# Patient Record
Sex: Male | Born: 1955
Health system: Southern US, Community
[De-identification: ages and names within clinical notes are randomized; demographics above are authoritative.]

## PROBLEM LIST (undated history)

## (undated) DIAGNOSIS — N529 Male erectile dysfunction, unspecified: Secondary | ICD-10-CM

## (undated) HISTORY — PX: TONSILLECTOMY: SUR1361

---

## 2009-06-02 ENCOUNTER — Encounter: Admission: RE | Admit: 2009-06-02 | Discharge: 2009-06-02 | Payer: Self-pay | Admitting: Family Medicine

## 2011-01-17 IMAGING — CT CT ABDOMEN WO/W CM
2 of 8 series · 14 of 46 positions shown, 19 images · IV contrast (READICAT/WATER & [ID] OMNI 300)
Comparison: None

CT ABDOMEN

CLINICAL DATA: Right lower quadrant pain.  Concern for
appendicitis colitis or kidney stones.

CT ABDOMEN WITHOUT AND WITH CONTRAST
CT PELVIS WITH CONTRAST
TECHNIQUE: Multidetector CT imaging of the abdomen was performed
initially following the standard protocol before administration of
intravenous contrast.  Multidetector CT imaging of the abdomen and
pelvis was then performed following the standard protocol during
the bolus injection of intravenous contrast.
Contrast: 125 ml Omnipaque 300

[Series 4: a&p w/ · axial · 0.74mm/px · z∈[-376,-1]mm · 11 of 89 slices shown, 16 images]
[im 7/89  soft-tissue]
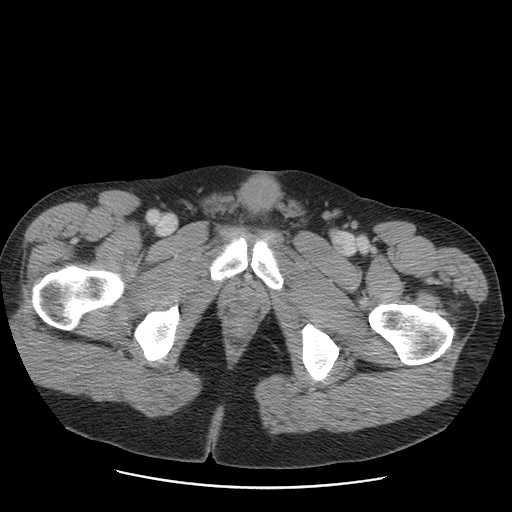
[im 7/89  bone]
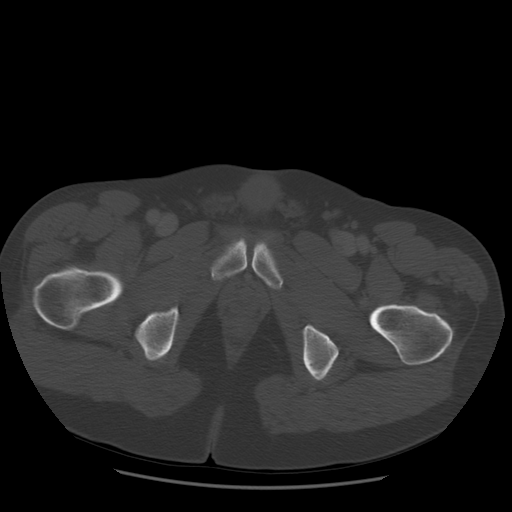
[im 14/89  soft-tissue]
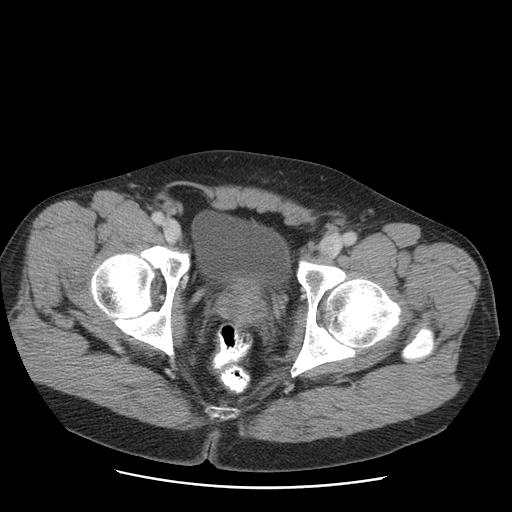
[im 28/89  soft-tissue]
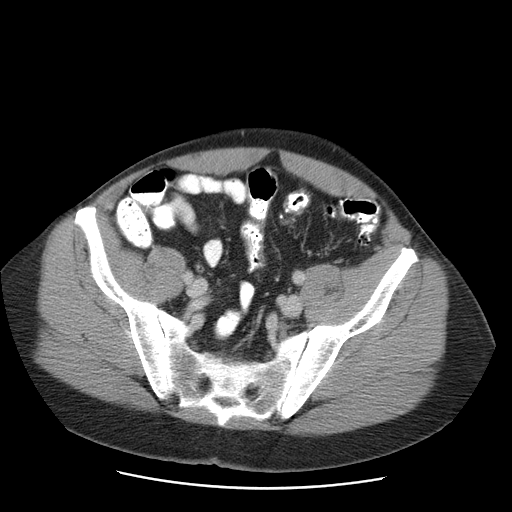
[im 34/89  soft-tissue]
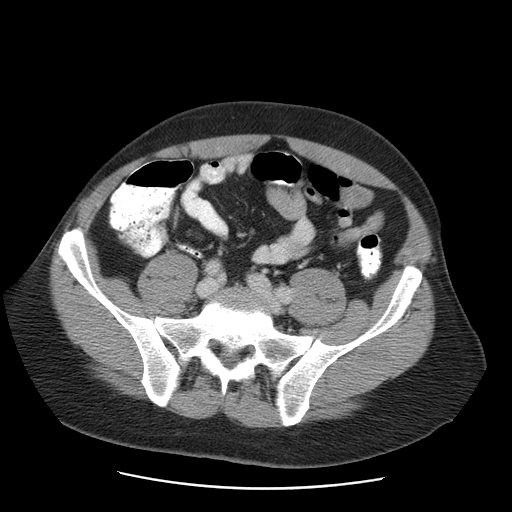
[im 41/89  soft-tissue]
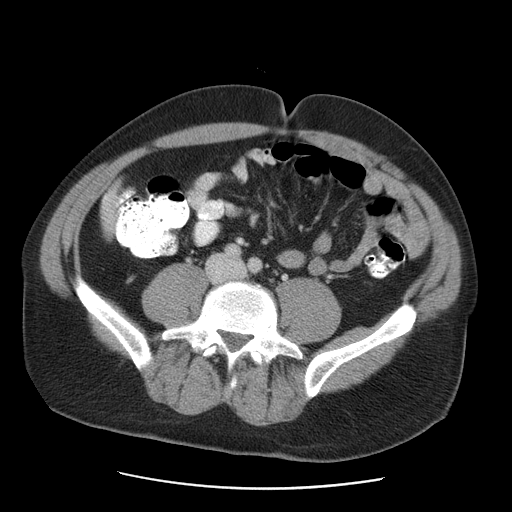
[im 48/89  soft-tissue]
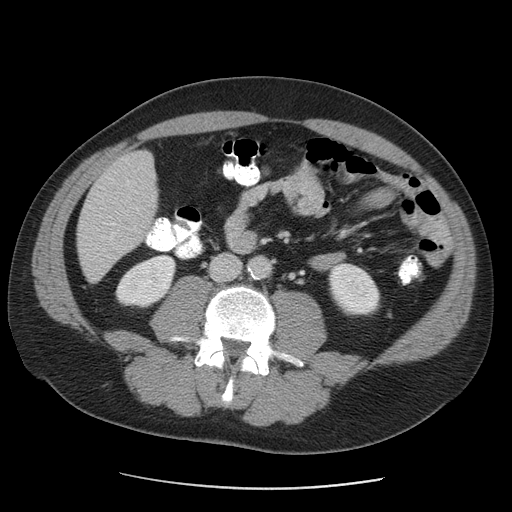
[im 55/89  soft-tissue]
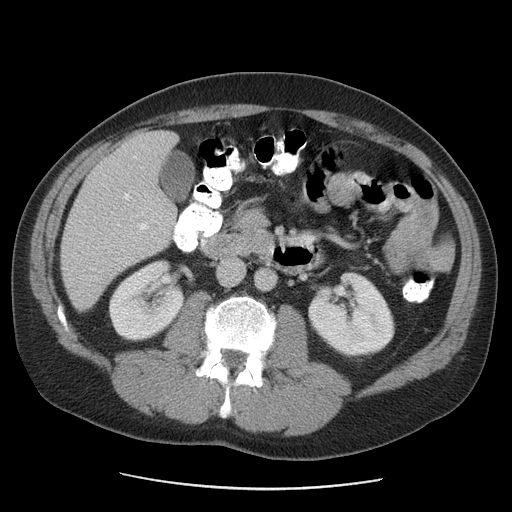
[im 61/89  lung]
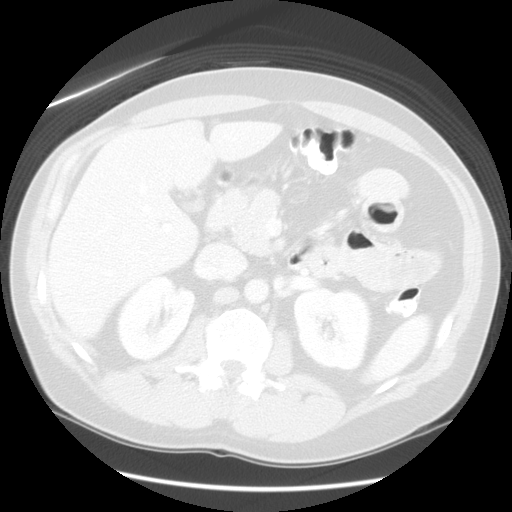
[im 68/89  soft-tissue]
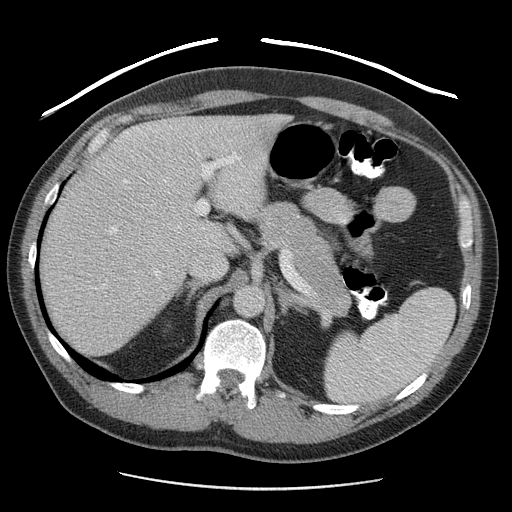
[im 68/89  lung]
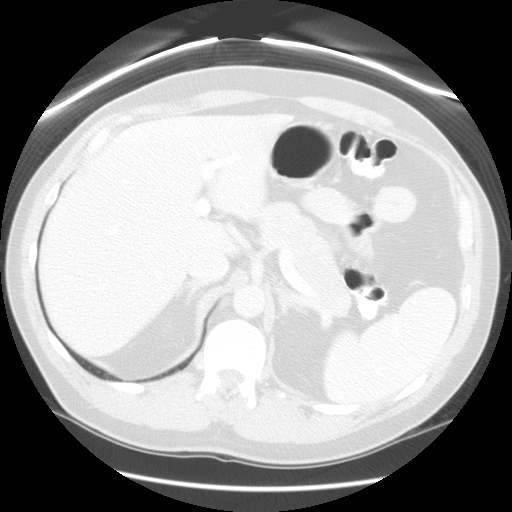
[im 75/89  soft-tissue]
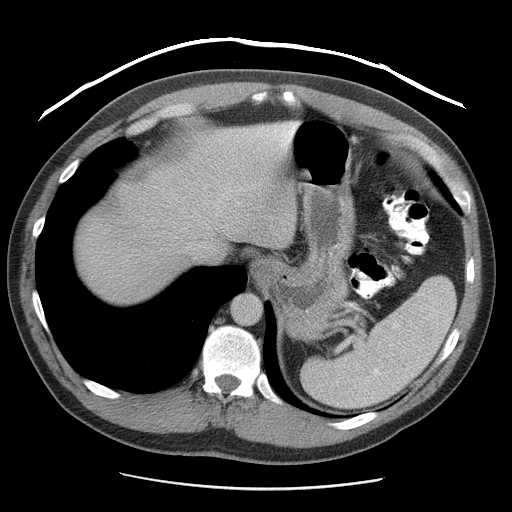
[im 75/89  lung]
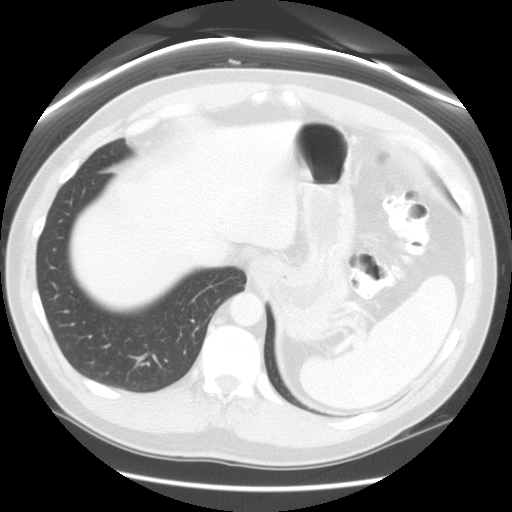
[im 75/89  bone]
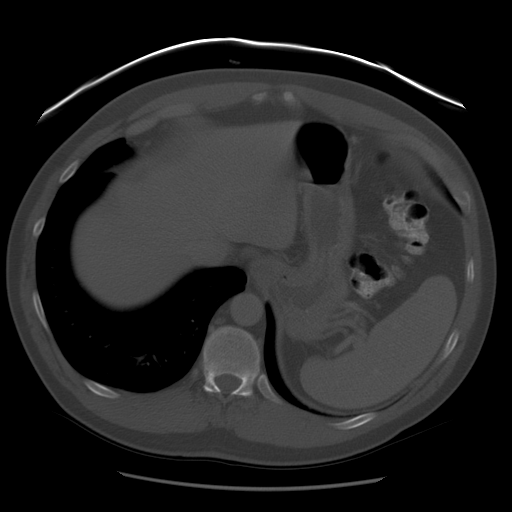
[im 82/89  soft-tissue]
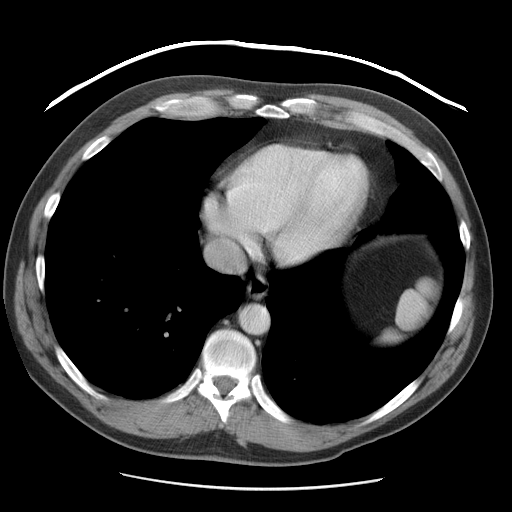
[im 82/89  lung]
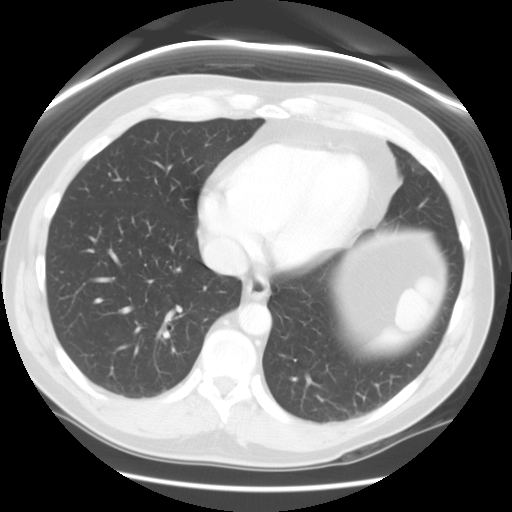

[Series 300: cor abd w/o · coronal · non-contrast · 0.74mm/px · 3 of 113 slices shown]
[im 38/113  soft-tissue]
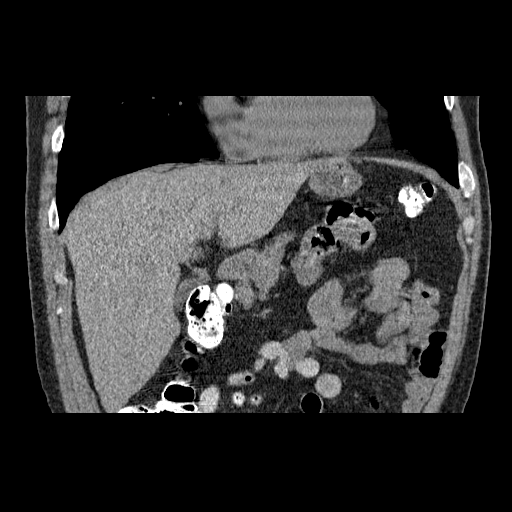
[im 50/113  soft-tissue]
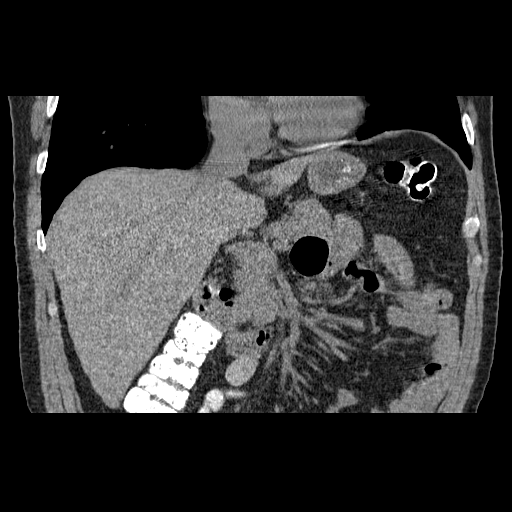
[im 63/113  soft-tissue]
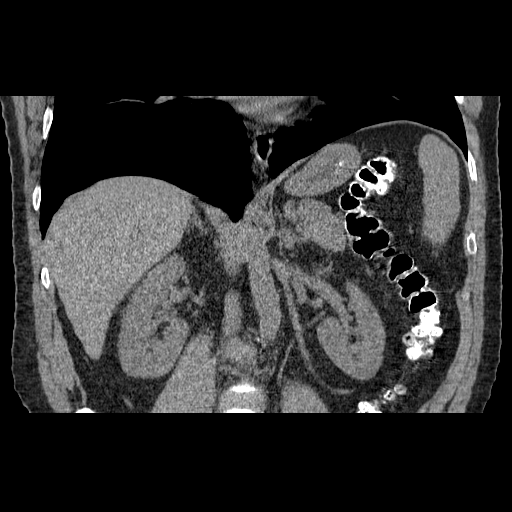

[14 of 46 positions shown; findings below may reference images not displayed]

FINDINGS: Lung bases are clear.  The base of the heart appears
normal.

No focal hepatic lesion.  The gallbladder, pancreas, spleen,
adrenal glands, and then the kidneys appear normal.  No evidence of
nephrolithiasis, ureterolithiasis or obstructive uropathy.  There
is a low density cortical lesion within the right kidney which is
too small to characterize.

The stomach, duodenum, small bowel, appendix, and cecum appear
normal.  Colon appears normal.

Abdominal aorta is normal caliber.  No retroperitoneal or
peritoneal lymphadenopathy.
IMPRESSION: 1.  No acute abdominal process.
2.  Normal appendix.
3.  No evidence of obstructive uropathy.

CT PELVIS
FINDINGS: The bladder, prostate and seminal vesicles appear
normal.  The rectum appears normal.  There is mild diverticular
disease of the proximal sigmoid colon without evidence of acute
inflammation.

No evidence of pelvic lymphadenopathy. Review of  bone windows
demonstrates no aggressive osseous lesions.
IMPRESSION: 1.  No acute pelvic process.
2.  Mild sigmoid diverticulosis without evidence of acute
diverticulitis.
3.  No explanation for right lower quadrant pain.

## 2018-02-18 DIAGNOSIS — I779 Disorder of arteries and arterioles, unspecified: Secondary | ICD-10-CM | POA: Diagnosis not present

## 2018-03-15 DIAGNOSIS — I83892 Varicose veins of left lower extremities with other complications: Secondary | ICD-10-CM | POA: Diagnosis not present

## 2018-03-15 DIAGNOSIS — L97301 Non-pressure chronic ulcer of unspecified ankle limited to breakdown of skin: Secondary | ICD-10-CM | POA: Diagnosis not present

## 2018-03-15 DIAGNOSIS — I83003 Varicose veins of unspecified lower extremity with ulcer of ankle: Secondary | ICD-10-CM | POA: Diagnosis not present

## 2018-03-18 ENCOUNTER — Other Ambulatory Visit (HOSPITAL_COMMUNITY): Payer: Self-pay | Admitting: Family Medicine

## 2018-03-18 DIAGNOSIS — L97321 Non-pressure chronic ulcer of left ankle limited to breakdown of skin: Secondary | ICD-10-CM

## 2018-03-18 DIAGNOSIS — I83892 Varicose veins of left lower extremities with other complications: Secondary | ICD-10-CM

## 2018-03-19 ENCOUNTER — Ambulatory Visit (HOSPITAL_COMMUNITY)
Admission: RE | Admit: 2018-03-19 | Discharge: 2018-03-19 | Disposition: A | Discharge status: Home / Self Care | Payer: 59 | Source: Ambulatory Visit | Attending: Vascular Surgery | Admitting: Vascular Surgery

## 2018-03-19 DIAGNOSIS — I83892 Varicose veins of left lower extremities with other complications: Secondary | ICD-10-CM | POA: Diagnosis not present

## 2018-03-19 DIAGNOSIS — L97321 Non-pressure chronic ulcer of left ankle limited to breakdown of skin: Secondary | ICD-10-CM | POA: Diagnosis not present

## 2018-03-20 ENCOUNTER — Encounter: Payer: Self-pay | Admitting: Vascular Surgery

## 2018-03-20 ENCOUNTER — Ambulatory Visit (INDEPENDENT_AMBULATORY_CARE_PROVIDER_SITE_OTHER): Payer: 59 | Admitting: Vascular Surgery

## 2018-03-20 ENCOUNTER — Other Ambulatory Visit: Payer: Self-pay

## 2018-03-20 VITALS — BP 130/75 | HR 104 | Resp 20 | Ht 71.0 in | Wt 210.6 lb

## 2018-03-20 DIAGNOSIS — I83892 Varicose veins of left lower extremities with other complications: Secondary | ICD-10-CM | POA: Diagnosis not present

## 2018-03-20 DIAGNOSIS — I868 Varicose veins of other specified sites: Secondary | ICD-10-CM

## 2018-03-20 NOTE — Progress Notes (Signed)
Referring Physician: Dr Docia Chuck  Patient name: Nicholas Silva MRN: [id number] DOB: June 27, 1955 Sex: male  REASON FOR CONSULT: Symptomatic varicose veins with bleeding leg  HPI: Nicholas Silva is a 63 y.o. male with a history of bleeding from the left ankle area.  He had a bleeding episode about 2 years ago.  He had been doing well until about 4 to 6 weeks ago when he had additional bleeding episode.  Both times it was from the left ankle.  He denies prior history of DVT.  He has no family history of varicose veins.  He has had skin discoloration of his left leg for several years.  He denies significant trauma to the left leg but thinks he may have had some minor injuries over the years.  He has not worn compression stockings in the past but yesterday got some 15 to 20 mm knee-high stockings.  He does have some intermittent swelling in the left leg and this improves with elevation.    History reviewed. No pertinent past medical history. History reviewed. No pertinent surgical history.  History reviewed. No pertinent family history.  SOCIAL HISTORY: Social History   Socioeconomic History  . Marital status: Unknown    Spouse name: Not on file  . Number of children: Not on file  . Years of education: Not on file  . Highest education level: Not on file  Occupational History  . Not on file  Social Needs  . Financial resource strain: Not on file  . Food insecurity:    Worry: Not on file    Inability: Not on file  . Transportation needs:    Medical: Not on file    Non-medical: Not on file  Tobacco Use  . Smoking status: Former Smoker    Types: Cigarettes    Last attempt to quit: 03/20/1938    Years since quitting: 80.0  . Smokeless tobacco: Never Used  Substance and Sexual Activity  . Alcohol use: Yes  . Drug use: Never  . Sexual activity: Not on file  Lifestyle  . Physical activity:    Days per week: Not on file    Minutes per session: Not on file  . Stress: Not on file    Relationships  . Social connections:    Talks on phone: Not on file    Gets together: Not on file    Attends religious service: Not on file    Active member of club or organization: Not on file    Attends meetings of clubs or organizations: Not on file    Relationship status: Not on file  . Intimate partner violence:    Fear of current or ex partner: Not on file    Emotionally abused: Not on file    Physically abused: Not on file    Forced sexual activity: Not on file  Other Topics Concern  . Not on file  Social History Narrative  . Not on file    No Known Allergies  Current Outpatient Medications  Medication Sig Dispense Refill  . methylphenidate (CONCERTA) 36 MG PO CR tablet Take by mouth.     No current facility-administered medications for this visit.     ROS:   General:  No weight loss, Fever, chills  HEENT: No recent headaches, no nasal bleeding, no visual changes, no sore throat  Neurologic: No dizziness, blackouts, seizures. No recent symptoms of stroke or mini- stroke. No recent episodes of slurred speech, or temporary blindness.  Cardiac:  No recent episodes of chest pain/pressure, no shortness of breath at rest.  No shortness of breath with exertion.  Denies history of atrial fibrillation or irregular heartbeat  Vascular: No history of rest pain in feet.  No history of claudication.  No history of non-healing ulcer, No history of DVT   Pulmonary: No home oxygen, no productive cough, no hemoptysis,  No asthma or wheezing  Musculoskeletal:  [ ]  Arthritis, [ ]  Low back pain,  [ ]  Joint pain  Hematologic:No history of hypercoagulable state.  No history of easy bleeding.  No history of anemia  Gastrointestinal: No hematochezia or melena,  No gastroesophageal reflux, no trouble swallowing  Urinary: [ ]  chronic Kidney disease, [ ]  on HD - [ ]  MWF or [ ]  TTHS, [ ]  Burning with urination, [ ]  Frequent urination, [ ]  Difficulty urinating;   Skin: No  rashes  Psychological: No history of anxiety,  No history of depression   Physical Examination  Vitals:   03/20/18 1346  BP: 130/75  Pulse: (!) 104  Resp: 20  SpO2: 95%  Weight: 210 lb 9.6 oz (95.5 kg)  Height: 5\' 11"  (1.803 m)    Body mass index is 29.37 kg/m.  General:  Alert and oriented, no acute distress HEENT: Normal Neck: No bruit or JVD Pulmonary: Clear to auscultation bilaterally Cardiac: Regular Rate and Rhythm without murmur Abdomen: Soft, non-tender, non-distended, no mass Skin: No rash, circumferential hemosiderin staining left leg gaiter area, 1 mm opening in the skin adjacent to the left medial malleolus from recent bleed, scattered large varicosities diffusely on the medial aspect of the left calf see images below       Extremity Pulses:  2+ radial, brachial, femoral, dorsalis pedis, posterior tibial pulses right side, absent posterior tibial dorsalis pedis pulse left foot slightly widened popliteal pulses bilaterally  musculoskeletal: No deformity race pretibial and pedal edema left foot and ankle area  Neurologic: Upper and lower extremity motor 5/5 and symmetric  DATA:  Patient had a venous reflux exam yesterday which showed diffuse reflux throughout the left greater saphenous vein with a vein diameter of 8 to 10 mm.  There was also diffuse reflux in the lesser saphenous vein on the left side with a vein diameter of 5 mm  ASSESSMENT: Symptomatic varicose veins with bleeding episodes now on 2 occasions.  Diffuse reflux left greater and lesser saphenous vein.  Absent pedal pulses left leg with slightly widened popliteal pulse   PLAN: Patient was given a prescription today for long leg compression stockings.  Return in 3 months time for consideration of laser ablation of the left greater and possibly the left lesser saphenous vein staged laser ablations.  When he returns we will also do an ultrasound of his popliteal artery and bilateral ABIs to make sure he  does not have an element of arterial occlusive disease or aneurysm.  Etiology of venous disease is today as well as risk benefits possible complications.   Fabienne Bruns, MD [facility] Office: [phone] Pager: [phone]

## 2018-03-21 ENCOUNTER — Other Ambulatory Visit: Payer: Self-pay

## 2018-03-21 DIAGNOSIS — I739 Peripheral vascular disease, unspecified: Secondary | ICD-10-CM

## 2018-03-30 DIAGNOSIS — Z23 Encounter for immunization: Secondary | ICD-10-CM | POA: Diagnosis not present

## 2018-06-21 DIAGNOSIS — N529 Male erectile dysfunction, unspecified: Secondary | ICD-10-CM | POA: Diagnosis not present

## 2018-07-17 ENCOUNTER — Ambulatory Visit (INDEPENDENT_AMBULATORY_CARE_PROVIDER_SITE_OTHER)
Admission: RE | Admit: 2018-07-17 | Discharge: 2018-07-17 | Disposition: A | Discharge status: Home / Self Care | Payer: 59 | Source: Ambulatory Visit | Attending: Vascular Surgery | Admitting: Vascular Surgery

## 2018-07-17 ENCOUNTER — Ambulatory Visit (HOSPITAL_COMMUNITY)
Admission: RE | Admit: 2018-07-17 | Discharge: 2018-07-17 | Disposition: A | Discharge status: Home / Self Care | Payer: 59 | Source: Ambulatory Visit | Attending: Vascular Surgery | Admitting: Vascular Surgery

## 2018-07-17 ENCOUNTER — Other Ambulatory Visit: Payer: Self-pay

## 2018-07-17 ENCOUNTER — Encounter: Payer: Self-pay | Admitting: Vascular Surgery

## 2018-07-17 ENCOUNTER — Ambulatory Visit (INDEPENDENT_AMBULATORY_CARE_PROVIDER_SITE_OTHER): Payer: 59 | Admitting: Vascular Surgery

## 2018-07-17 VITALS — BP 133/83 | HR 97 | Temp 98.0°F | Resp 18 | Ht 71.0 in | Wt 215.0 lb

## 2018-07-17 DIAGNOSIS — I739 Peripheral vascular disease, unspecified: Secondary | ICD-10-CM | POA: Diagnosis present

## 2018-07-17 DIAGNOSIS — I83812 Varicose veins of left lower extremities with pain: Secondary | ICD-10-CM

## 2018-07-17 NOTE — Progress Notes (Signed)
Patient is a 63 year old male who returns for follow-up today.  He was last seen March 20, 2018.  At that time he was complaining of symptomatic varicose veins in the left leg.  He has had several bleeding episodes in the left leg.  He denies any bleeding episodes since his last office visit.  He has been compliant wearing his compression stockings.  He states this does improve the swelling somewhat but not fully.  He has also not had complete healing of his skin.  Review of systems: He denies claudication.  He denies chest pain.  He denies shortness of breath.  Current Outpatient Medications on File Prior to Visit  Medication Sig Dispense Refill  . methylphenidate (CONCERTA) 36 MG PO CR tablet Take by mouth.     No current facility-administered medications on file prior to visit.     Physical exam:  Vitals:   07/17/18 1416  BP: 133/83  Pulse: 97  Resp: 18  Temp: 98 F (36.7 C)  TempSrc: Oral  SpO2: 95%  Weight: 215 lb (97.5 kg)  Height: 5\' 11"  (1.803 m)    Extremities: Plus dorsalis pedis pulses absent no posterior tibial pulse  Kin: Hemosiderin changes and thinning of the skin left leg as depicted from office visit, bulging varicosities left medial calf 10-20 at least these are about 6 to 7 mm diameter.  I used the SonoSite to examine the patient's left greater saphenous vein which again is 8 to 10 mm all the way the saphenofemoral junction.  Data: Patient had bilateral ABIs today which were greater than 1 and normal triphasic bilaterally.  He also had a popliteal ultrasound for widened popliteal pulse which showed no evidence of popliteal aneurysm on the left side.  I reviewed his previous venous reflux exam of the left leg which showed diffuse reflux throughout the entire left greater saphenous vein with 8 to 10 mm vein diameter.  He also had lesser saphenous vein diameter of about 5 mm.  Assessment: Symptomatic varicose veins with skin thinning skin changes prior bleeding  episode diffuse left greater saphenous reflux.  Plan: I recommended the patient laser ablation stab avulsions 10-20 left leg for CEAP class V.  His symptoms are not completely approved by this we could consider whether or not laser ablation of his left lesser saphenous would be beneficial.  Fabienne Bruns, MD [facility] Office: [phone] Pager: [phone]

## 2018-08-13 ENCOUNTER — Other Ambulatory Visit: Payer: Self-pay | Admitting: *Deleted

## 2018-08-13 DIAGNOSIS — I83812 Varicose veins of left lower extremities with pain: Secondary | ICD-10-CM

## 2018-10-02 ENCOUNTER — Other Ambulatory Visit: Payer: 59 | Admitting: Vascular Surgery

## 2018-10-09 ENCOUNTER — Encounter (HOSPITAL_COMMUNITY): Payer: 59

## 2018-10-09 ENCOUNTER — Ambulatory Visit: Payer: 59 | Admitting: Vascular Surgery

## 2018-11-12 ENCOUNTER — Other Ambulatory Visit: Payer: Self-pay | Admitting: *Deleted

## 2018-11-12 DIAGNOSIS — I83812 Varicose veins of left lower extremities with pain: Secondary | ICD-10-CM

## 2019-01-15 ENCOUNTER — Ambulatory Visit (INDEPENDENT_AMBULATORY_CARE_PROVIDER_SITE_OTHER): Payer: Self-pay | Admitting: Vascular Surgery

## 2019-01-15 ENCOUNTER — Other Ambulatory Visit: Payer: Self-pay

## 2019-01-15 ENCOUNTER — Encounter: Payer: Self-pay | Admitting: Vascular Surgery

## 2019-01-15 VITALS — BP 126/85 | HR 94 | Temp 98.3°F | Resp 16 | Ht 71.0 in | Wt 215.0 lb

## 2019-01-15 DIAGNOSIS — I83812 Varicose veins of left lower extremities with pain: Secondary | ICD-10-CM | POA: Diagnosis not present

## 2019-01-15 HISTORY — PX: ENDOVENOUS ABLATION SAPHENOUS VEIN W/ LASER: SUR449

## 2019-01-15 NOTE — Progress Notes (Signed)
     Laser Ablation Procedure    Date: 01/15/2019   UNNAMED ZEIEN DOB:09/02/1955  Consent signed: Yes    Surgeon:  Dr. Ruta Hinds  Procedure: Laser Ablation: left Greater Saphenous Vein  BP 126/85 (BP Location: Left Arm, Patient Position: Sitting, Cuff Size: Normal)   Pulse 94   Temp 98.3 F (36.8 C) (Temporal)   Resp 16   Ht 5\' 11"  (1.803 m)   Wt 215 lb (97.5 kg)   SpO2 96%   BMI 29.99 kg/m   Tumescent Anesthesia: 450 cc 0.9% NaCl with 50 cc Lidocaine HCL 1 % and 15 cc 8.4% NaHCO3  Local Anesthesia: 8 cc Lidocaine HCL and NaHCO3 (ratio 2:1)  15 watts continuous mode        Total energy: 2207 Joules   Total time: 2:27   Stab Phlebectomy: 10-20 Sites: Calf and Ankle  Patient tolerated procedure well  Notes: Mr. Lebeau wore facial mask.  All staff members wore facial masks and facial shields.    Description of Procedure:  After marking the course of the secondary varicosities, the patient was placed on the operating table in the supine position, and the left leg was prepped and draped in sterile fashion.   Local anesthetic was administered and under ultrasound guidance the saphenous vein was accessed with a micro needle and guide wire; then the mirco puncture sheath was placed.  A guide wire was inserted saphenofemoral junction , followed by a 5 french sheath.  The position of the sheath and then the laser fiber below the junction was confirmed using the ultrasound.  Tumescent anesthesia was administered along the course of the saphenous vein using ultrasound guidance. The patient was placed in Trendelenburg position and protective laser glasses were placed on patient and staff, and the laser was fired at 15 watts continuous mode advancing 1-70mm/second for a total of 2207 joules.   For stab phlebectomies, local anesthetic was administered at the previously marked varicosities, and tumescent anesthesia was administered around the vessels.  Ten to 20 stab wounds were made  using the tip of an 11 blade. And using the vein hook, the phlebectomies were performed using a hemostat to avulse the varicosities.  Adequate hemostasis was achieved.    Steri strips were applied to the stab wounds and ABD pads and thigh high compression stockings were applied.  Ace wrap bandages were applied over the phlebectomy sites and at the top of the saphenofemoral junction. Blood loss was less than 15 cc.  The patient ambulated out of the operating room having tolerated the procedure well.  Ruta Hinds, MD [facility] Office: [phone] Pager: [phone]

## 2019-01-22 ENCOUNTER — Ambulatory Visit (INDEPENDENT_AMBULATORY_CARE_PROVIDER_SITE_OTHER): Payer: Self-pay | Admitting: Vascular Surgery

## 2019-01-22 ENCOUNTER — Ambulatory Visit (HOSPITAL_COMMUNITY)
Admission: RE | Admit: 2019-01-22 | Discharge: 2019-01-22 | Disposition: A | Discharge status: Home / Self Care | Payer: 59 | Source: Ambulatory Visit | Attending: Family | Admitting: Family

## 2019-01-22 ENCOUNTER — Encounter: Payer: Self-pay | Admitting: Vascular Surgery

## 2019-01-22 ENCOUNTER — Other Ambulatory Visit: Payer: Self-pay

## 2019-01-22 VITALS — BP 133/88 | HR 84 | Temp 98.5°F | Resp 18 | Ht 71.0 in | Wt 217.4 lb

## 2019-01-22 DIAGNOSIS — I83812 Varicose veins of left lower extremities with pain: Secondary | ICD-10-CM

## 2019-01-22 NOTE — Progress Notes (Signed)
Patient is a 63 year old male who returns for follow-up today.  He underwent laser ablation of his left greater saphenous vein with stab avulsions approximately 1 week ago.  He has minimal tenderness at this point.  He has no shortness of breath or chest pain.  He states he thinks the veins in his leg have decompressed somewhat.  There is still some prominent veins on the dorsum of the right foot.  He continues to be compliant wearing compression.  He really has no complaints in the right leg.  Physical exam:  Vitals:   01/22/19 1017  BP: 133/88  Pulse: 84  Resp: 18  Temp: 98.5 F (36.9 C)  TempSrc: Temporal  SpO2: 94%  Weight: 217 lb 6.4 oz (98.6 kg)  Height: 5\' 11"  (1.803 m)    Left lower extremity: Well-healed incisions left leg  Data: Patient had a duplex ultrasound today which shows successful closure of the left greater saphenous vein all the way up to the saphenofemoral junction.  I reviewed his previous duplex exam which also showed his lesser saphenous vein in the left leg is dilated with reflux.  It is about 5 to 8 mm in diameter.  Assessment: Doing well status post laser ablation left greater saphenous vein and stab avulsions.  The patient does have known reflux in the left lesser saphenous vein as well.  He has fairly significant skin changes in the left leg.  If these do not resolve or improve over the next few months consideration could be given for laser ablation of his left lesser saphenous vein as well.  Plan: Patient will follow-up with [place] on an as-needed basis.  If he has worsening of his symptoms long-term or no significant improvement we would consider laser ablation of his left greater saphenous vein in the future.  Ruta Hinds, MD [facility] [place] Office: [phone] Pager: [phone]

## 2019-03-11 ENCOUNTER — Telehealth: Payer: Self-pay | Admitting: Vascular Surgery

## 2019-03-11 NOTE — Telephone Encounter (Signed)
°  Patient sent in email to [facility] regarding statements that he was getting from [facility] for the full amount of his visit with Dr. Eden Lathe after reviewing his account it was never filled with his insurance. I spoke with Heather in billing at [facility] she did fix this and has sent the claim to his insurance company, I called and spoke with patient and explained the situation he verbal understood and thanked me for calling.    [facility]

## 2019-05-20 ENCOUNTER — Encounter: Payer: Self-pay | Admitting: Vascular Surgery

## 2020-02-22 ENCOUNTER — Other Ambulatory Visit: Payer: Self-pay

## 2020-02-22 ENCOUNTER — Emergency Department (HOSPITAL_COMMUNITY): Payer: 59

## 2020-02-22 ENCOUNTER — Emergency Department (HOSPITAL_COMMUNITY)
Admission: EM | Admit: 2020-02-22 | Discharge: 2020-02-23 | Disposition: A | Discharge status: Home / Self Care | Payer: 59 | Attending: Emergency Medicine | Admitting: Emergency Medicine

## 2020-02-22 ENCOUNTER — Encounter (HOSPITAL_COMMUNITY): Payer: Self-pay | Admitting: Emergency Medicine

## 2020-02-22 DIAGNOSIS — Z5321 Procedure and treatment not carried out due to patient leaving prior to being seen by health care provider: Secondary | ICD-10-CM | POA: Diagnosis not present

## 2020-02-22 DIAGNOSIS — R079 Chest pain, unspecified: Secondary | ICD-10-CM | POA: Insufficient documentation

## 2020-02-22 HISTORY — DX: Male erectile dysfunction, unspecified: N52.9

## 2020-02-22 LAB — CBC
HCT: 40.4 % (ref 39.0–52.0)
Hemoglobin: 13 g/dL (ref 13.0–17.0)
MCH: 22.4 pg — ABNORMAL LOW (ref 26.0–34.0)
MCHC: 32.2 g/dL (ref 30.0–36.0)
MCV: 69.5 fL — ABNORMAL LOW (ref 80.0–100.0)
Platelets: 247 10*3/uL (ref 150–400)
RBC: 5.81 MIL/uL (ref 4.22–5.81)
RDW: 16 % — ABNORMAL HIGH (ref 11.5–15.5)
WBC: 14.8 10*3/uL — ABNORMAL HIGH (ref 4.0–10.5)
nRBC: 0 % (ref 0.0–0.2)

## 2020-02-22 LAB — BASIC METABOLIC PANEL
Anion gap: 9 (ref 5–15)
BUN: 18 mg/dL (ref 8–23)
CO2: 27 mmol/L (ref 22–32)
Calcium: 8.8 mg/dL — ABNORMAL LOW (ref 8.9–10.3)
Chloride: 104 mmol/L (ref 98–111)
Creatinine, Ser: 1.05 mg/dL (ref 0.61–1.24)
GFR calc Af Amer: >60 mL/min (ref >60)
GFR calc non Af Amer: >60 mL/min (ref >60)
Glucose, Bld: 160 mg/dL — ABNORMAL HIGH (ref 70–99)
Potassium: 3.9 mmol/L (ref 3.5–5.1)
Sodium: 140 mmol/L (ref 135–145)

## 2020-02-22 LAB — TROPONIN I (HIGH SENSITIVITY): Troponin I (High Sensitivity): 3 ng/L (ref <18)
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
## 2020-02-22 NOTE — ED Notes (Signed)
Called Pt again for room and no answer

## 2020-02-22 NOTE — ED Notes (Signed)
Called Pt multiple times to room and no answer

## 2020-11-26 ENCOUNTER — Other Ambulatory Visit: Payer: Self-pay | Admitting: Family Medicine

## 2020-11-26 DIAGNOSIS — R1013 Epigastric pain: Secondary | ICD-10-CM

## 2020-11-30 ENCOUNTER — Encounter (INDEPENDENT_AMBULATORY_CARE_PROVIDER_SITE_OTHER): Payer: Self-pay

## 2020-11-30 ENCOUNTER — Ambulatory Visit
Admission: RE | Admit: 2020-11-30 | Discharge: 2020-11-30 | Disposition: A | Discharge status: Home / Self Care | Payer: Managed Care, Other (non HMO) | Source: Ambulatory Visit | Attending: Family Medicine | Admitting: Family Medicine

## 2020-11-30 DIAGNOSIS — R1013 Epigastric pain: Secondary | ICD-10-CM

## 2020-12-14 ENCOUNTER — Ambulatory Visit: Payer: Self-pay | Admitting: Surgery

## 2020-12-14 DIAGNOSIS — K8020 Calculus of gallbladder without cholecystitis without obstruction: Secondary | ICD-10-CM

## 2020-12-14 NOTE — H&P (Signed)
History of Present Illness (Nicholas Silva L. Nicholas Silva; 12/14/2020 9:22 AM) The patient is a 65 year old male who presents with abdominal pain.HPI: [name] is a 65 yo male who was referred for gallstones. About a year ago he started having epigastric abdominal pain. It usually lasts for 1-2 hours and then resolves. He has been having the episodes intermittently, the most recent one was 3-4 weeks ago. He has tried taking Nexium but did not have any improvement in his symptoms. At that time he also noticed dark urine and chills, but no jaundice. His urine is now normal in color. He had a RUQ [place] on 6/14 that showed gallstones. He has not had any LFTs.  PMH: ADHD  PSH: Saphenous vein ablation; no prior abdominal surgeries  FHx: Brother had gallbladder disease.   Past Surgical History Arbutus Ped, CMA; 12/14/2020 8:54 AM) Tonsillectomy    Diagnostic Studies History Arbutus Ped, CMA; 12/14/2020 8:54 AM) Colonoscopy   1-5 years ago  Allergies Arbutus Ped, CMA; 12/14/2020 8:55 AM) No Known Drug Allergies   [12/14/2020]: Allergies Reconciled    Medication History Arbutus Ped, CMA; 12/14/2020 8:57 AM) Methylphenidate HCl ER  (36MG  Tablet ER, Oral) Active. Iron Slow Release  (140 (45 Fe)MG Tablet ER, Oral) Active. Medications Reconciled   Social History , CMA; 12/14/2020 8:54 AM) Alcohol use   Moderate alcohol use. Caffeine use   Coffee. Illicit drug use   Remotely quit drug use. Tobacco use   Former smoker.  Family History 12/16/2020, CMA; 12/14/2020 8:54 AM) Alcohol Abuse   Brother. Colon Polyps   Mother. Diabetes Mellitus   Father.    Review of Systems 12/16/2020 CMA; 12/14/2020 8:54 AM) General Not Present- Appetite Loss, Chills, Fatigue, Fever, Night Sweats, Weight Gain and Weight Loss. Skin Not Present- Change in Wart/Mole, Dryness, Hives, Jaundice, New Lesions, Non-Healing Wounds, Rash and Ulcer. HEENT Present- Wears glasses/contact lenses. Not Present- Earache, Hearing Loss,  Hoarseness, Nose Bleed, Oral Ulcers, Ringing in the Ears, Seasonal Allergies, Sinus Pain, Sore Throat, Visual Disturbances and Yellow Eyes. Respiratory Not Present- Bloody sputum, Chronic Cough, Difficulty Breathing, Snoring and Wheezing. Breast Not Present- Breast Mass, Breast Pain, Nipple Discharge and Skin Changes. Cardiovascular Not Present- Chest Pain, Difficulty Breathing Lying Down, Leg Cramps, Palpitations, Rapid Heart Rate, Shortness of Breath and Swelling of Extremities. Gastrointestinal Present- Abdominal Pain. Not Present- Bloating, Bloody Stool, Change in Bowel Habits, Chronic diarrhea, Constipation, Difficulty Swallowing, Excessive gas, Gets full quickly at meals, Hemorrhoids, Indigestion, Nausea, Rectal Pain and Vomiting.  Vitals 12/16/2020 CMA; 12/14/2020 8:57 AM) 12/14/2020 8:56 AM Weight: 215.13 lb   Height: 70 in  Body Surface Area: 2.15 m   Body Mass Index: 30.87 kg/m   Temp.: 97.6 F    Pulse: 113 (Regular)    P.OX: 97% (Room air) BP: 130/68(Sitting, Left Arm, Standard)       Physical Exam (Chioke Noxon L. 12/16/2020 Silva; 12/14/2020 9:23 AM) The physical exam findings are as follows: Note:  Constitutional: No acute distress; conversant; no deformities Neuro: alert and oriented; cranial nerves grossly in tact; no focal deficits Eyes: Moist conjunctiva; anicteric sclerae; extraocular movements in tact Neck: Trachea midline Lungs: Normal respiratory effort; lungs clear to auscultation bilaterally; symmetric chest wall expansion CV: Regular rate and rhythm; no murmurs; no pitting edema GI: Abdomen soft, nontender, nondistended; no masses or organomegaly; no surgical scars MSK: Normal gait and station; no clubbing/cyanosis Psychiatric: Appropriate affect; alert and oriented 3    Assessment & Plan ([name] L. 12/16/2020 Silva;  12/14/2020 9:24 AM) SYMPTOMATIC CHOLELITHIASIS (K80.20) Story: 65 yo male with epigastric abdominal pain, most likely secondary to cholelithiasis. I reviewed his  [place], which shows multiple shadowing gallstones but no signs of acute cholecystitis. I discussed the details of laparoscopic cholecystectomy with the patient, including the benefits and risks of bleeding, infection, and <0.5% risk of common bile duct injury. He agrees to proceed with surgery. He will be contacted to schedule a date. Will check LFTs today given his reported episode of dark urine with pain. He is not clinically jaundiced on exam.  Sophronia Simas, Silva [name] Surgery General, Hepatobiliary and Pancreatic Surgery 12/14/20 9:25 AM

## 2021-01-07 NOTE — Pre-Procedure Instructions (Signed)
Surgical Instructions    Your procedure is scheduled on Thursday 01/20/21.   Report to [facility] Main Entrance "A" at 09:00 A.M., then check in with the Admitting office.  Call this number if you have problems the morning of surgery:  [phone]   If you have any questions prior to your surgery date call [phone]: Open Monday-Friday 8am-4pm    Remember:  Do not eat or drink after midnight the night before your surgery     Take these medicines the morning of surgery with A SIP OF WATER NONE      As of today, STOP taking any Aspirin (unless otherwise instructed by your surgeon) Aleve, Naproxen, Ibuprofen, Motrin, Advil, Goody's, BC's, all herbal medications, fish oil, and all vitamins.          Do not wear jewelry or makeup Do not wear lotions, powders, perfumes/colognes, or deodorant. Do not shave 48 hours prior to surgery.  Men may shave face and neck. Do not bring valuables to the hospital. DO Not wear nail polish, gel polish, artificial nails, or any other type of covering on natural nails including finger and toenails. If patients have artificial nails, gel coating, etc. that need to be removed by a nail salon please have this removed prior to surgery or surgery may need to be canceled/delayed if the surgeon/ anesthesia feels like the patient is unable to be adequately monitored.             [facility] is not responsible for any belongings or valuables.  Do NOT Smoke (Tobacco/Vaping) or drink Alcohol 24 hours prior to your procedure If you use a CPAP at night, you may bring all equipment for your overnight stay.   Contacts, glasses, dentures or bridgework may not be worn into surgery, please bring cases for these belongings   For patients admitted to the hospital, discharge time will be determined by your treatment team.   Patients discharged the day of surgery will not be allowed to drive home, and someone needs to stay with them for 24 hours.  ONLY 1 SUPPORT PERSON  MAY BE PRESENT WHILE YOU ARE IN SURGERY. IF YOU ARE TO BE ADMITTED ONCE YOU ARE IN YOUR ROOM YOU WILL BE ALLOWED TWO (2) VISITORS.  Minor children may have two parents present. Special consideration for safety and communication needs will be reviewed on a case by case basis.  Special instructions:    Oral Hygiene is also important to reduce your risk of infection.  Remember - BRUSH YOUR TEETH THE MORNING OF SURGERY WITH YOUR REGULAR TOOTHPASTE   [place]- Preparing For Surgery  Before surgery, you can play an important role. Because skin is not sterile, your skin needs to be as free of germs as possible. You can reduce the number of germs on your skin by washing with CHG (chlorahexidine gluconate) Soap before surgery.  CHG is an antiseptic cleaner which kills germs and bonds with the skin to continue killing germs even after washing.     Please do not use if you have an allergy to CHG or antibacterial soaps. If your skin becomes reddened/irritated stop using the CHG.  Do not shave (including legs and underarms) for at least 48 hours prior to first CHG shower. It is OK to shave your face.  Please follow these instructions carefully.     Shower the NIGHT BEFORE SURGERY and the MORNING OF SURGERY with CHG Soap.   If you chose to wash your hair, wash your  hair first as usual with your normal shampoo. After you shampoo, rinse your hair and body thoroughly to remove the shampoo.  Then [employer] and genitals (private parts) with your normal soap and rinse thoroughly to remove soap.  After that Use CHG Soap as you would any other liquid soap. You can apply CHG directly to the skin and wash gently with a scrungie or a clean washcloth.   Apply the CHG Soap to your body ONLY FROM THE NECK DOWN.  Do not use on open wounds or open sores. Avoid contact with your eyes, ears, mouth and genitals (private parts). Wash Face and genitals (private parts)  with your normal soap.   Wash thoroughly, paying  special attention to the area where your surgery will be performed.  Thoroughly rinse your body with warm water from the neck down.  DO NOT shower/wash with your normal soap after using and rinsing off the CHG Soap.  Pat yourself dry with a CLEAN TOWEL.  Wear CLEAN PAJAMAS to bed the night before surgery  Place CLEAN SHEETS on your bed the night before your surgery  DO NOT SLEEP WITH PETS.   Day of Surgery:  Take a shower with CHG soap. Wear Clean/Comfortable clothing the morning of surgery Do not apply any deodorants/lotions.   Remember to brush your teeth WITH YOUR REGULAR TOOTHPASTE.   Please read over the following fact sheets that you were given.

## 2021-01-10 ENCOUNTER — Other Ambulatory Visit: Payer: Self-pay

## 2021-01-10 ENCOUNTER — Encounter (HOSPITAL_COMMUNITY): Payer: Self-pay

## 2021-01-10 ENCOUNTER — Encounter (HOSPITAL_COMMUNITY)
Admission: RE | Admit: 2021-01-10 | Discharge: 2021-01-10 | Disposition: A | Discharge status: Home / Self Care | Payer: Managed Care, Other (non HMO) | Source: Ambulatory Visit | Attending: Surgery | Admitting: Surgery

## 2021-01-10 DIAGNOSIS — Z01812 Encounter for preprocedural laboratory examination: Secondary | ICD-10-CM | POA: Diagnosis present

## 2021-01-10 LAB — COMPREHENSIVE METABOLIC PANEL
ALT: 17 U/L (ref 0–44)
AST: 31 U/L (ref 15–41)
Albumin: 3.7 g/dL (ref 3.5–5.0)
Alkaline Phosphatase: 54 U/L (ref 38–126)
Anion gap: 8 (ref 5–15)
BUN: 11 mg/dL (ref 8–23)
CO2: 24 mmol/L (ref 22–32)
Calcium: 8.8 mg/dL — ABNORMAL LOW (ref 8.9–10.3)
Chloride: 104 mmol/L (ref 98–111)
Creatinine, Ser: 1 mg/dL (ref 0.61–1.24)
GFR, Estimated: >60 mL/min (ref >60)
Glucose, Bld: 104 mg/dL — ABNORMAL HIGH (ref 70–99)
Potassium: 4.4 mmol/L (ref 3.5–5.1)
Sodium: 136 mmol/L (ref 135–145)
Total Bilirubin: 1.6 mg/dL — ABNORMAL HIGH (ref 0.3–1.2)
Total Protein: 6.1 g/dL — ABNORMAL LOW (ref 6.5–8.1)

## 2021-01-10 LAB — CBC
HCT: 42.9 % (ref 39.0–52.0)
Hemoglobin: 13.7 g/dL (ref 13.0–17.0)
MCH: 22.2 pg — ABNORMAL LOW (ref 26.0–34.0)
MCHC: 31.9 g/dL (ref 30.0–36.0)
MCV: 69.6 fL — ABNORMAL LOW (ref 80.0–100.0)
Platelets: 229 10*3/uL (ref 150–400)
RBC: 6.16 MIL/uL — ABNORMAL HIGH (ref 4.22–5.81)
RDW: 17.6 % — ABNORMAL HIGH (ref 11.5–15.5)
WBC: 9.4 10*3/uL (ref 4.0–10.5)
nRBC: 0 % (ref 0.0–0.2)

## 2021-01-10 NOTE — Progress Notes (Signed)
PCP - Docia Chuck, Dibas MD Cardiologist - pt denies  PPM/ICD - n/a  Chest x-ray - n/a EKG - 02/26/20 Stress Test - n/a ECHO - n/a Cardiac Cath - n/a   Sleep Study - n/a  Fasting Blood Sugar - n/a  Blood Thinner Instructions:n/a Aspirin Instructions: n/a  NPO after midnight  COVID TEST- n/a   Anesthesia review: no  Patient denies shortness of breath, fever, cough and chest pain at PAT appointment   All instructions explained to the patient, with a verbal understanding of the material. Patient agrees to go over the instructions while at home for a better understanding. Patient also instructed to self quarantine after being tested for COVID-19. The opportunity to ask questions was provided.

## 2021-01-19 NOTE — Progress Notes (Signed)
LVM for pt to arrive at 0645 for his surgery now starting at 0845.

## 2021-01-20 ENCOUNTER — Ambulatory Visit (HOSPITAL_COMMUNITY): Payer: Managed Care, Other (non HMO)

## 2021-01-20 ENCOUNTER — Ambulatory Visit (HOSPITAL_COMMUNITY): Payer: Managed Care, Other (non HMO) | Admitting: Anesthesiology

## 2021-01-20 ENCOUNTER — Other Ambulatory Visit: Payer: Self-pay

## 2021-01-20 ENCOUNTER — Ambulatory Visit (HOSPITAL_COMMUNITY)
Admission: RE | Admit: 2021-01-20 | Discharge: 2021-01-20 | Disposition: A | Discharge status: Home / Self Care | Payer: Managed Care, Other (non HMO) | Attending: Surgery | Admitting: Surgery

## 2021-01-20 ENCOUNTER — Encounter (HOSPITAL_COMMUNITY)
Admission: RE | Disposition: A | Discharge status: Home / Self Care | Payer: Self-pay | Source: Home / Self Care | Attending: Surgery

## 2021-01-20 ENCOUNTER — Encounter (HOSPITAL_COMMUNITY): Payer: Self-pay | Admitting: Surgery

## 2021-01-20 DIAGNOSIS — K8010 Calculus of gallbladder with chronic cholecystitis without obstruction: Secondary | ICD-10-CM | POA: Insufficient documentation

## 2021-01-20 DIAGNOSIS — Z87891 Personal history of nicotine dependence: Secondary | ICD-10-CM | POA: Insufficient documentation

## 2021-01-20 DIAGNOSIS — Z419 Encounter for procedure for purposes other than remedying health state, unspecified: Secondary | ICD-10-CM

## 2021-01-20 HISTORY — PX: CHOLECYSTECTOMY: SHX55

## 2021-01-20 SURGERY — LAPAROSCOPIC CHOLECYSTECTOMY WITH INTRAOPERATIVE CHOLANGIOGRAM
Anesthesia: General | Site: Abdomen

## 2021-01-20 MED ORDER — OXYCODONE HCL 5 MG PO TABS
5.0000 mg | ORAL_TABLET | Freq: Once | ORAL | Status: AC | PRN
  Administered 2021-01-20: 5 mg via ORAL

## 2021-01-20 MED ORDER — LACTATED RINGERS IV SOLN
INTRAVENOUS | Status: DC
Start: 2021-01-20 — End: 2021-01-20

## 2021-01-20 MED ORDER — ACETAMINOPHEN 500 MG PO TABS
ORAL_TABLET | ORAL | Status: AC
  Administered 2021-01-20: 1000 mg via ORAL
  Filled 2021-01-20: qty 2

## 2021-01-20 MED ORDER — CHLORHEXIDINE GLUCONATE 0.12 % MT SOLN
15.0000 mL | Freq: Once | OROMUCOSAL | Status: AC
  Administered 2021-01-20: 15 mL via OROMUCOSAL
  Filled 2021-01-20: qty 15

## 2021-01-20 MED ORDER — BUPIVACAINE-EPINEPHRINE (PF) 0.25% -1:200000 IJ SOLN
INTRAMUSCULAR | Status: AC
  Filled 2021-01-20: qty 30

## 2021-01-20 MED ORDER — FENTANYL CITRATE (PF) 250 MCG/5ML IJ SOLN
INTRAMUSCULAR | Status: AC
  Filled 2021-01-20: qty 5

## 2021-01-20 MED ORDER — EPINEPHRINE 1 MG/10ML IJ SOSY
PREFILLED_SYRINGE | INTRAMUSCULAR | Status: AC
  Filled 2021-01-20: qty 10

## 2021-01-20 MED ORDER — GABAPENTIN 300 MG PO CAPS
ORAL_CAPSULE | ORAL | Status: AC
  Administered 2021-01-20: 300 mg via ORAL
  Filled 2021-01-20: qty 1

## 2021-01-20 MED ORDER — ACETAMINOPHEN 10 MG/ML IV SOLN: 1000.0000 mg | Freq: Once | INTRAVENOUS | Status: DC | PRN

## 2021-01-20 MED ORDER — FENTANYL CITRATE (PF) 100 MCG/2ML IJ SOLN: 25.0000 ug | INTRAMUSCULAR | Status: DC | PRN

## 2021-01-20 MED ORDER — ORAL CARE MOUTH RINSE: 15.0000 mL | Freq: Once | OROMUCOSAL | Status: AC

## 2021-01-20 MED ORDER — SUGAMMADEX SODIUM 200 MG/2ML IV SOLN
INTRAVENOUS | Status: DC | PRN
  Administered 2021-01-20: 200 mg via INTRAVENOUS

## 2021-01-20 MED ORDER — LACTATED RINGERS IV SOLN: INTRAVENOUS | Status: DC | PRN

## 2021-01-20 MED ORDER — SODIUM CHLORIDE 0.9 % IR SOLN
Status: DC | PRN
  Administered 2021-01-20: 1000 mL

## 2021-01-20 MED ORDER — ACETAMINOPHEN 500 MG PO TABS
1000.0000 mg | ORAL_TABLET | ORAL | Status: AC
Start: 2021-01-20 — End: 2021-01-20

## 2021-01-20 MED ORDER — ACETAMINOPHEN 160 MG/5ML PO SOLN: 325.0000 mg | ORAL | Status: DC | PRN

## 2021-01-20 MED ORDER — AMISULPRIDE (ANTIEMETIC) 5 MG/2ML IV SOLN: 10.0000 mg | Freq: Once | INTRAVENOUS | Status: DC | PRN

## 2021-01-20 MED ORDER — IOHEXOL 300 MG/ML  SOLN
INTRAMUSCULAR | Status: DC | PRN
  Administered 2021-01-20: 12.5 mL
  Administered 2021-01-20: 5 mL

## 2021-01-20 MED ORDER — MIDAZOLAM HCL 2 MG/2ML IJ SOLN
INTRAMUSCULAR | Status: AC
  Filled 2021-01-20: qty 2

## 2021-01-20 MED ORDER — PHENYLEPHRINE 40 MCG/ML (10ML) SYRINGE FOR IV PUSH (FOR BLOOD PRESSURE SUPPORT)
PREFILLED_SYRINGE | INTRAVENOUS | Status: DC | PRN
  Administered 2021-01-20: 40 ug via INTRAVENOUS
  Administered 2021-01-20: 120 ug via INTRAVENOUS
  Administered 2021-01-20: 40 ug via INTRAVENOUS

## 2021-01-20 MED ORDER — PHENYLEPHRINE 40 MCG/ML (10ML) SYRINGE FOR IV PUSH (FOR BLOOD PRESSURE SUPPORT)
PREFILLED_SYRINGE | INTRAVENOUS | Status: AC
  Filled 2021-01-20: qty 10

## 2021-01-20 MED ORDER — 0.9 % SODIUM CHLORIDE (POUR BTL) OPTIME
TOPICAL | Status: DC | PRN
  Administered 2021-01-20: 1000 mL

## 2021-01-20 MED ORDER — PROPOFOL 10 MG/ML IV BOLUS
INTRAVENOUS | Status: AC
  Filled 2021-01-20: qty 40

## 2021-01-20 MED ORDER — PROPOFOL 10 MG/ML IV BOLUS
INTRAVENOUS | Status: DC | PRN
  Administered 2021-01-20: 160 mg via INTRAVENOUS

## 2021-01-20 MED ORDER — ACETAMINOPHEN 325 MG PO TABS: 325.0000 mg | ORAL_TABLET | ORAL | Status: DC | PRN

## 2021-01-20 MED ORDER — PROMETHAZINE HCL 25 MG/ML IJ SOLN: 6.2500 mg | INTRAMUSCULAR | Status: DC | PRN

## 2021-01-20 MED ORDER — DEXAMETHASONE SODIUM PHOSPHATE 10 MG/ML IJ SOLN
INTRAMUSCULAR | Status: AC
  Filled 2021-01-20: qty 1

## 2021-01-20 MED ORDER — LIDOCAINE 2% (20 MG/ML) 5 ML SYRINGE
INTRAMUSCULAR | Status: AC
  Filled 2021-01-20: qty 5

## 2021-01-20 MED ORDER — MIDAZOLAM HCL 5 MG/5ML IJ SOLN
INTRAMUSCULAR | Status: DC | PRN
  Administered 2021-01-20 (×2): 1 mg via INTRAVENOUS

## 2021-01-20 MED ORDER — LIDOCAINE 2% (20 MG/ML) 5 ML SYRINGE
INTRAMUSCULAR | Status: DC | PRN
  Administered 2021-01-20: 40 mg via INTRAVENOUS

## 2021-01-20 MED ORDER — FENTANYL CITRATE (PF) 250 MCG/5ML IJ SOLN
INTRAMUSCULAR | Status: DC | PRN
  Administered 2021-01-20: 125 ug via INTRAVENOUS
  Administered 2021-01-20 (×2): 25 ug via INTRAVENOUS

## 2021-01-20 MED ORDER — ONDANSETRON HCL 4 MG/2ML IJ SOLN
INTRAMUSCULAR | Status: AC
  Filled 2021-01-20: qty 2

## 2021-01-20 MED ORDER — HYDROCODONE-ACETAMINOPHEN 5-325 MG PO TABS: 1.0000 | ORAL_TABLET | Freq: Four times a day (QID) | ORAL | 0 refills | Status: AC | PRN

## 2021-01-20 MED ORDER — CEFAZOLIN SODIUM-DEXTROSE 2-4 GM/100ML-% IV SOLN
INTRAVENOUS | Status: AC
  Filled 2021-01-20: qty 100

## 2021-01-20 MED ORDER — OXYCODONE HCL 5 MG/5ML PO SOLN: 5.0000 mg | Freq: Once | ORAL | Status: AC | PRN

## 2021-01-20 MED ORDER — GABAPENTIN 300 MG PO CAPS: 300.0000 mg | ORAL_CAPSULE | ORAL | Status: AC

## 2021-01-20 MED ORDER — OXYCODONE HCL 5 MG PO TABS
ORAL_TABLET | ORAL | Status: AC
  Filled 2021-01-20: qty 1

## 2021-01-20 MED ORDER — CEFAZOLIN SODIUM-DEXTROSE 2-4 GM/100ML-% IV SOLN
2.0000 g | INTRAVENOUS | Status: AC
Start: 2021-01-20 — End: 2021-01-20
  Administered 2021-01-20: 2 g via INTRAVENOUS

## 2021-01-20 MED ORDER — ONDANSETRON HCL 4 MG/2ML IJ SOLN
INTRAMUSCULAR | Status: DC | PRN
  Administered 2021-01-20: 4 mg via INTRAVENOUS

## 2021-01-20 MED ORDER — BUPIVACAINE-EPINEPHRINE 0.25% -1:200000 IJ SOLN
INTRAMUSCULAR | Status: DC | PRN
  Administered 2021-01-20: 15 mL

## 2021-01-20 MED ORDER — ROCURONIUM BROMIDE 10 MG/ML (PF) SYRINGE
PREFILLED_SYRINGE | INTRAVENOUS | Status: AC
  Filled 2021-01-20: qty 10

## 2021-01-20 MED ORDER — DEXAMETHASONE SODIUM PHOSPHATE 10 MG/ML IJ SOLN
INTRAMUSCULAR | Status: DC | PRN
  Administered 2021-01-20: 5 mg via INTRAVENOUS

## 2021-01-20 MED ORDER — ROCURONIUM BROMIDE 100 MG/10ML IV SOLN
INTRAVENOUS | Status: DC | PRN
  Administered 2021-01-20: 60 mg via INTRAVENOUS
  Administered 2021-01-20: 10 mg via INTRAVENOUS
  Administered 2021-01-20: 20 mg via INTRAVENOUS
  Administered 2021-01-20: 10 mg via INTRAVENOUS

## 2021-01-20 SURGICAL SUPPLY — 51 items
ADH SKN CLS APL DERMABOND .7 (GAUZE/BANDAGES/DRESSINGS) ×2
APL PRP STRL LF DISP 70% ISPRP (MISCELLANEOUS) ×2
APPLIER CLIP 5 13 M/L LIGAMAX5 (MISCELLANEOUS) ×3
APR CLP MED LRG 5 ANG JAW (MISCELLANEOUS) ×2
BAG COUNTER SPONGE SURGICOUNT (BAG) ×3 IMPLANT
BAG SPEC RTRVL LRG 6X4 10 (ENDOMECHANICALS) ×2
BAG SPNG CNTER NS LX DISP (BAG) ×2
BLADE CLIPPER SURG (BLADE) ×2 IMPLANT
CANISTER SUCT 3000ML PPV (MISCELLANEOUS) ×3 IMPLANT
CHLORAPREP W/TINT 26 (MISCELLANEOUS) ×3 IMPLANT
CLIP APPLIE 5 13 M/L LIGAMAX5 (MISCELLANEOUS) ×2 IMPLANT
COVER SURGICAL LIGHT HANDLE (MISCELLANEOUS) ×3 IMPLANT
DERMABOND ADVANCED (GAUZE/BANDAGES/DRESSINGS) ×1
DERMABOND ADVANCED .7 DNX12 (GAUZE/BANDAGES/DRESSINGS) ×2 IMPLANT
DRAPE C-ARM 42X120 X-RAY (DRAPES) ×2 IMPLANT
ELECT REM PT RETURN 9FT ADLT (ELECTROSURGICAL) ×3
ELECTRODE REM PT RTRN 9FT ADLT (ELECTROSURGICAL) ×2 IMPLANT
ENDOLOOP SUT PDS II  0 18 (SUTURE) ×6
ENDOLOOP SUT PDS II 0 18 (SUTURE) ×2 IMPLANT
GLOVE SURG POLY MICRO LF SZ5.5 (GLOVE) ×3 IMPLANT
GLOVE SURG UNDER POLY LF SZ6 (GLOVE) ×3 IMPLANT
GOWN STRL REUS W/ TWL LRG LVL3 (GOWN DISPOSABLE) ×6 IMPLANT
GOWN STRL REUS W/TWL LRG LVL3 (GOWN DISPOSABLE) ×9
IV CATH 14GX2 1/4 (CATHETERS) ×2 IMPLANT
KIT BASIN OR (CUSTOM PROCEDURE TRAY) ×3 IMPLANT
KIT TURNOVER KIT B (KITS) ×3 IMPLANT
L-HOOK LAP DISP 36CM (ELECTROSURGICAL) ×3
LHOOK LAP DISP 36CM (ELECTROSURGICAL) ×2 IMPLANT
NDL INSUFFLATION 14GA 120MM (NEEDLE) IMPLANT
NEEDLE INSUFFLATION 14GA 120MM (NEEDLE) IMPLANT
NS IRRIG 1000ML POUR BTL (IV SOLUTION) ×3 IMPLANT
PAD ARMBOARD 7.5X6 YLW CONV (MISCELLANEOUS) ×3 IMPLANT
PENCIL BUTTON HOLSTER BLD 10FT (ELECTRODE) ×3 IMPLANT
POUCH SPECIMEN RETRIEVAL 10MM (ENDOMECHANICALS) ×3 IMPLANT
SCISSORS LAP 5X35 DISP (ENDOMECHANICALS) ×3 IMPLANT
SET CHOLANGIOGRAPHY FRANKLIN (SET/KITS/TRAYS/PACK) ×2 IMPLANT
SET IRRIG TUBING LAPAROSCOPIC (IRRIGATION / IRRIGATOR) ×3 IMPLANT
SET TUBE SMOKE EVAC HIGH FLOW (TUBING) ×3 IMPLANT
SLEEVE ENDOPATH XCEL 5M (ENDOMECHANICALS) ×6 IMPLANT
SPECIMEN JAR SMALL (MISCELLANEOUS) ×3 IMPLANT
SUT MNCRL AB 4-0 PS2 18 (SUTURE) ×3 IMPLANT
SUT VIC AB 3-0 SH 27 (SUTURE)
SUT VIC AB 3-0 SH 27XBRD (SUTURE) IMPLANT
SUT VICRYL 0 UR6 27IN ABS (SUTURE) ×3 IMPLANT
TOWEL GREEN STERILE (TOWEL DISPOSABLE) ×3 IMPLANT
TOWEL GREEN STERILE FF (TOWEL DISPOSABLE) ×3 IMPLANT
TRAY LAPAROSCOPIC MC (CUSTOM PROCEDURE TRAY) ×3 IMPLANT
TROCAR XCEL 12X100 BLDLESS (ENDOMECHANICALS) IMPLANT
TROCAR XCEL BLUNT TIP 100MML (ENDOMECHANICALS) ×3 IMPLANT
TROCAR XCEL NON-BLD 5MMX100MML (ENDOMECHANICALS) ×3 IMPLANT
WATER STERILE IRR 1000ML POUR (IV SOLUTION) ×3 IMPLANT

## 2021-01-20 NOTE — Anesthesia Preprocedure Evaluation (Addendum)
Anesthesia Evaluation  Patient identified by MRN, date of birth, ID band Patient awake    Reviewed: Allergy & Precautions, NPO status , Patient's Chart, lab work & pertinent test results  Airway Mallampati: II  TM Distance: >3 FB Neck ROM: Full    Dental  (+) Teeth Intact, Dental Advisory Given   Pulmonary former smoker,    breath sounds clear to auscultation       Cardiovascular negative cardio ROS   Rhythm:Regular Rate:Normal     Neuro/Psych negative neurological ROS  negative psych ROS   GI/Hepatic negative GI ROS, Neg liver ROS,   Endo/Other  negative endocrine ROS  Renal/GU negative Renal ROS     Musculoskeletal negative musculoskeletal ROS (+)   Abdominal Normal abdominal exam  (+)   Peds  Hematology negative hematology ROS (+)   Anesthesia Other Findings   Reproductive/Obstetrics                            Anesthesia Physical Anesthesia Plan  ASA: 2  Anesthesia Plan: General   Post-op Pain Management:    Induction: Intravenous  PONV Risk Score and Plan: 3 and Ondansetron, Dexamethasone and Midazolam  Airway Management Planned: Oral ETT  Additional Equipment: None  Intra-op Plan:   Post-operative Plan: Extubation in OR  Informed Consent: I have reviewed the patients History and Physical, chart, labs and discussed the procedure including the risks, benefits and alternatives for the proposed anesthesia with the patient or authorized representative who has indicated his/her understanding and acceptance.     Dental advisory given  Plan Discussed with: CRNA  Anesthesia Plan Comments:        Anesthesia Quick Evaluation  

## 2021-01-20 NOTE — Op Note (Signed)
Date: 01/20/21  Patient: Nicholas Silva MRN: [id number]  Preoperative Diagnosis: Symptomatic cholelithiasis Postoperative Diagnosis: Symptomatic cholelithiasis, chronic cholecystitis  Procedure: Laparoscopic cholecystectomy with intraoperative cholangiogram  Surgeon: Sophronia Simas, MD Assistant: Kris Mouton, MD  EBL: Minimal  Anesthesia: General  Specimens: Gallbladder  Indications: [name] is a 65 yo male who presented with epigastric abdominal pain and was found to have gallstones. After a discussion of the risks and benefits of surgery, he agreed to proceed with cholecystectomy.  Findings: Cholelithiasis with very thickened, contracted gallbladder consistent with chronic cholecystitis. Normal cholangiogram.  Procedure details: Informed consent was obtained in the preoperative area prior to the procedure. The patient was brought to the operating room and placed on the table in the supine position. General anesthesia was induced and appropriate lines and drains were placed for intraoperative monitoring. Perioperative antibiotics were administered per SCIP guidelines. The abdomen was prepped and draped in the usual sterile fashion. A pre-procedure timeout was taken verifying patient identity, surgical site and procedure to be performed.  A small supraumbilical skin incision was made, the subcutaneous tissue was divided with cautery, and the umbilical stalk was grasped and elevated. The fascia was incised and the peritoneal cavity was directly visualized. A 43mm Hassan trocar was placed. The peritoneal cavity was inspected with no evidence of visceral or vascular injury. Three 33mm ports were placed in the right subcostal margin, all under direct visualization. The fundus of the gallbladder was grasped and retracted cephalad. The gallbladder was very contracted and the wall very thickened, consistent with chronic cholecystitis. The omentum was adherent to the gallbladder, and this was  carefully taken down with blunt dissection and cautery. There was an area near the fundus of the gallbladder where a stone appeared to have eroded through the wall of the gallbladder and was walled off by omentum. Once the omentum had been taken off the gallbladder, the entire gallbladder appeared thick and fibrotic, and the cystic triangle was not readily visible. Dissection was started high on the gallbladder, opening the peritoneum with cautery and moving inferiorly. The cystic triangle had dense fibrotic tissue, making this dissection very tedious. The right hepatic artery was visualized posterior to the gallbladder entering the liver. It was very difficult to delineate the anatomy of the cystic duct and I was concerned that the common bile duct was being pulled up toward the gallbladder. Thus the decision was made to proceed with a cholangiogram. A defect was made high on the gallbladder wall and the cholangiocatheter was passed into the lumen. A cholangiogram was attempted but showed only filling of the gallbladder with no passage of contrast into the cystic duct. The catheter was removed. The gallbladder was opened but the lumen was so contracted and fibrotic that passage of the catheter down into the cystic duct was not possible. Further careful blunt dissection was performed along the infundibulum of the gallbladder. There was a small defect on the infundibulum which was draining bile. The cholangiocatheter was passed through this defect down into the biliary tree. A cholangiogram was performed and confirmed that the catheter was in the cystic duct with several centimeters of length left on the cystic duct prior to its insertion into the common bile duct. The entire common bile duct and common hepatic duct filled and were in continuity. The right and left hepatic ducts filled and contrast passed into the duodenum. The cholangiocatheter was removed. Now that the location of the cystic duct had been  confirmed, the duct was circumferentially dissected out.  It was divided with cautery and the stump was closed with 2 separate PDS endoloops. The cystic artery had already been divided with cautery during the dissection. The gallbladder was taken off the liver using cautery. The back wall of the gallbladder was very thickened and adherent to the liver and I felt that dissecting it off the liver would lead to significant bleeding, so the back wall was left in place and the remainder of the gallbladder was removed. The mucosal surface of the back wall was fulgurated with cautery. The specimen was placed in an endocatch bag and removed. The surgical site was irrigated with saline until the effluent was clear. Hemostasis was achieved in the gallbladder fossa using cautery. The cystic duct stump was visualized and there was no evidence of bile leak. The right hepatic artery had a visible pulse. The ports were removed under direct visualization and the abdomen was desufflated. The umbilical port site fascia was closed with a 0 vicryl suture. The skin at all port sites was closed with 4-0 monocryl subcuticular suture. Dermabond was applied. The gallbladder was examined on the back table prior to sending it to pathology, and although it was very thickened, the tissue was soft. There were no firm masses that would be concerning for malignancy.  The patient tolerated the procedure with no apparent complications. All counts were correct x2 at the end of the procedure. The patient was extubated and taken to PACU in stable condition.  Sophronia Simas, MD 01/20/21 11:02 AM

## 2021-01-20 NOTE — H&P (Signed)
Nicholas Silva is an 65 y.o. male.    HPI: Nicholas Silva is a 65 yo male who was referred for gallstones. About a year ago he started having epigastric abdominal pain. It usually lasts for 1-2 hours and then resolves. He has been having the episodes intermittently, the most recent one was 3-4 weeks ago. He has tried taking Nexium but did not have any improvement in his symptoms. At that time he also noticed dark urine and chills, but no jaundice. His urine is now normal in color. He had a RUQ [place] on 6/14 that showed gallstones. He presents today for cholecystectomy.  Preop labs last week showed a mildly elevated bilirubin on 1.6. The patient has not had any visible jaundice and has not had any more episodes of dark urine.   Past Medical History:  Diagnosis Date   Erectile dysfunction     Past Surgical History:  Procedure Laterality Date   ENDOVENOUS ABLATION SAPHENOUS VEIN W/ LASER Left 01/15/2019   endovenous laser ablation left greater saphenous vein and stab phlebectomy 10-20 incisions left leg by Fabienne Bruns MD    TONSILLECTOMY      History reviewed. No pertinent family history. Social History:  reports that he quit smoking about 21 years ago. His smoking use included cigarettes. He has never used smokeless tobacco. He reports current alcohol use of about 7.0 standard drinks of alcohol per week. He reports that he does not use drugs.  Allergies: No Known Allergies  Medications Prior to Admission  Medication Sig Dispense Refill   Cholecalciferol (VITAMIN D3 PO) Take 1 tablet by mouth daily.     Cyanocobalamin (B-12 PO) Take 1 tablet by mouth daily.     methylphenidate 36 MG PO CR tablet Take 36 mg by mouth daily.      No results found for this or any previous visit (from the past 48 hour(s)). No results found.  Review of Systems  Blood pressure 134/79, pulse 82, temperature 97.7 F (36.5 C), resp. rate 20, height 5\' 11"  (1.803 m), weight 96.9 kg, SpO2 95 %. Physical  Exam Vitals reviewed.  Constitutional:      Appearance: Normal appearance.  HENT:     Head: Normocephalic and atraumatic.  Eyes:     General: No scleral icterus.    Conjunctiva/sclera: Conjunctivae normal.  Pulmonary:     Effort: Pulmonary effort is normal. No respiratory distress.  Abdominal:     General: There is no distension.     Palpations: Abdomen is soft.     Tenderness: There is no abdominal tenderness.  Musculoskeletal:        General: Normal range of motion.     Cervical back: Normal range of motion.  Skin:    General: Skin is warm and dry.     Coloration: Skin is not jaundiced.  Neurological:     General: No focal deficit present.     Mental Status: He is alert and oriented to person, place, and time.  Psychiatric:        Mood and Affect: Mood normal.        Behavior: Behavior normal.        Thought Content: Thought content normal.     Assessment/Plan 65 yo male with symptomatic cholelithiasis. Proceed to OR for laparoscopic cholecystectomy with intraop cholangiogram. Informed consent obtained. Plan for discharge home from PACU.  77, MD 01/20/2021, 8:25 AM

## 2021-01-20 NOTE — Anesthesia Procedure Notes (Addendum)
Procedure Name: Intubation Date/Time: 01/20/2021 9:53 AM Performed by: Erick Colace, RN Pre-anesthesia Checklist: Patient identified, Emergency Drugs available, Suction available, Timeout performed and Patient being monitored Patient Re-evaluated:Patient Re-evaluated prior to induction Oxygen Delivery Method: Circle system utilized Preoxygenation: Pre-oxygenation with 100% oxygen Induction Type: IV induction Ventilation: Mask ventilation without difficulty and Oral airway inserted - appropriate to patient size Laryngoscope Size: Mac and 4 Grade View: Grade II Tube type: Oral Tube size: 7.5 mm Airway Equipment and Method: Stylet Placement Confirmation: ETT inserted through vocal cords under direct vision, positive ETCO2, CO2 detector and breath sounds checked- equal and bilateral Secured at: 23 cm Tube secured with: Tape Dental Injury: Teeth and Oropharynx as per pre-operative assessment

## 2021-01-20 NOTE — Discharge Instructions (Addendum)
[facility] DISCHARGE INSTRUCTIONS  Activity No heavy lifting greater than 15 pounds for 4 weeks after surgery. Ok to shower, but do not bathe or submerge incisions underwater. Do not drive while taking narcotic pain medication.  Wound Care Your incision is covered with skin glue called Dermabond. This will peel off on its own over time. You may shower and allow warm soapy water to run over your incisions. Gently pat dry. Do not submerge your incision underwater. Monitor your incision for any new redness, tenderness, or drainage.  When to Call us: Fever greater than 100.5 New redness, drainage, or swelling at incision site Severe pain, nausea, or vomiting Jaundice (yellowing of the whites of the eyes or skin)  Follow-up You have an appointment scheduled with Dr. Freida Busman on February 08, 2021 at 9:45am. This will be at the [facility] Surgery office at [address], [address], [place], [place]. Please arrive at least 15 minutes prior to your scheduled appointment time.  For questions or concerns, please call the office at [phone].

## 2021-01-20 NOTE — Anesthesia Postprocedure Evaluation (Signed)
Anesthesia Post Note  Patient: Nicholas Silva  Procedure(s) Performed: LAPAROSCOPIC CHOLECYSTECTOMY WITH INTRAOPERATIVE CHOLANGIOGRAM (Abdomen)     Patient location during evaluation: PACU Anesthesia Type: General Level of consciousness: awake and alert Pain management: pain level controlled Vital Signs Assessment: post-procedure vital signs reviewed and stable Respiratory status: spontaneous breathing, nonlabored ventilation, respiratory function stable and patient connected to nasal cannula oxygen Cardiovascular status: blood pressure returned to baseline and stable Postop Assessment: no apparent nausea or vomiting Anesthetic complications: no   No notable events documented.  Last Vitals:  Vitals:   01/20/21 1247 01/20/21 1259  BP: 122/70 112/71  Pulse: 84 90  Resp: 17 13  Temp:  36.7 C  SpO2: 94% 97%    Last Pain:  Vitals:   01/20/21 1202  PainSc: 0-No pain                 [name]

## 2021-01-20 NOTE — Transfer of Care (Signed)
Immediate Anesthesia Transfer of Care Note  Patient: Nicholas Silva  Procedure(s) Performed: LAPAROSCOPIC CHOLECYSTECTOMY WITH INTRAOPERATIVE CHOLANGIOGRAM (Abdomen)  Patient Location: PACU  Anesthesia Type:General  Level of Consciousness: awake and alert   Airway & Oxygen Therapy: Patient Spontanous Breathing and Patient connected to face mask oxygen  Post-op Assessment: Report given to RN and Post -op Vital signs reviewed and stable  Post vital signs: Reviewed and stable  Last Vitals:  Vitals Value Taken Time  BP 119/70 01/20/21 1217  Temp 36.8 C 01/20/21 1100  Pulse 80 01/20/21 1224  Resp 16 01/20/21 1224  SpO2 95 % 01/20/21 1224  Vitals shown include unvalidated device data.  Last Pain:  Vitals:   01/20/21 1202  PainSc: 0-No pain         Complications: No notable events documented.

## 2021-01-21 ENCOUNTER — Encounter (HOSPITAL_COMMUNITY): Payer: Self-pay | Admitting: Surgery

## 2021-01-21 LAB — SURGICAL PATHOLOGY

## 2021-10-08 IMAGING — CR DG CHEST 2V
2 series · 2 of 2 positions shown · non-contrast
Comparison: None.

CLINICAL DATA: Chest pain.

EXAM:
CHEST - 2 VIEW

[chest pa]
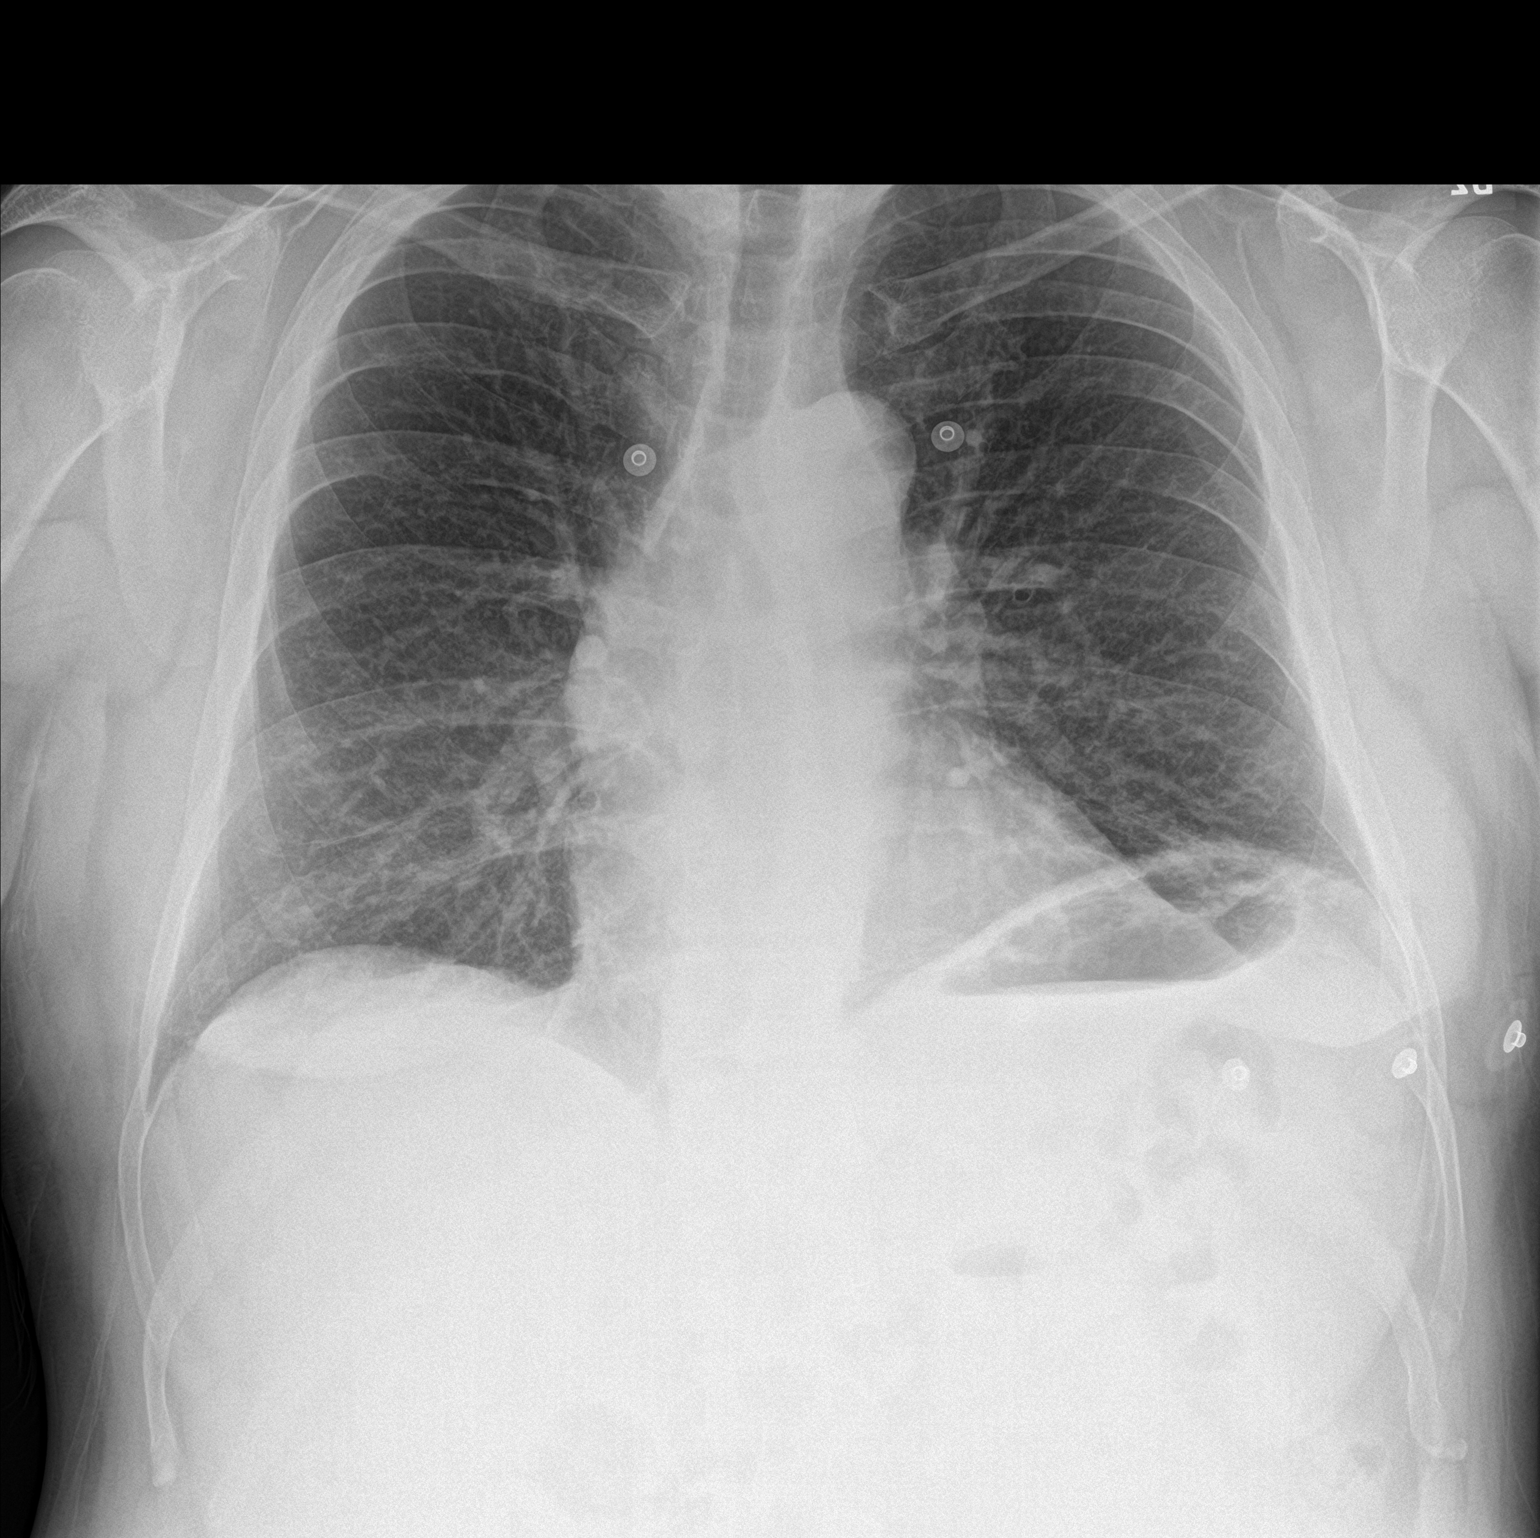

[chest lat]
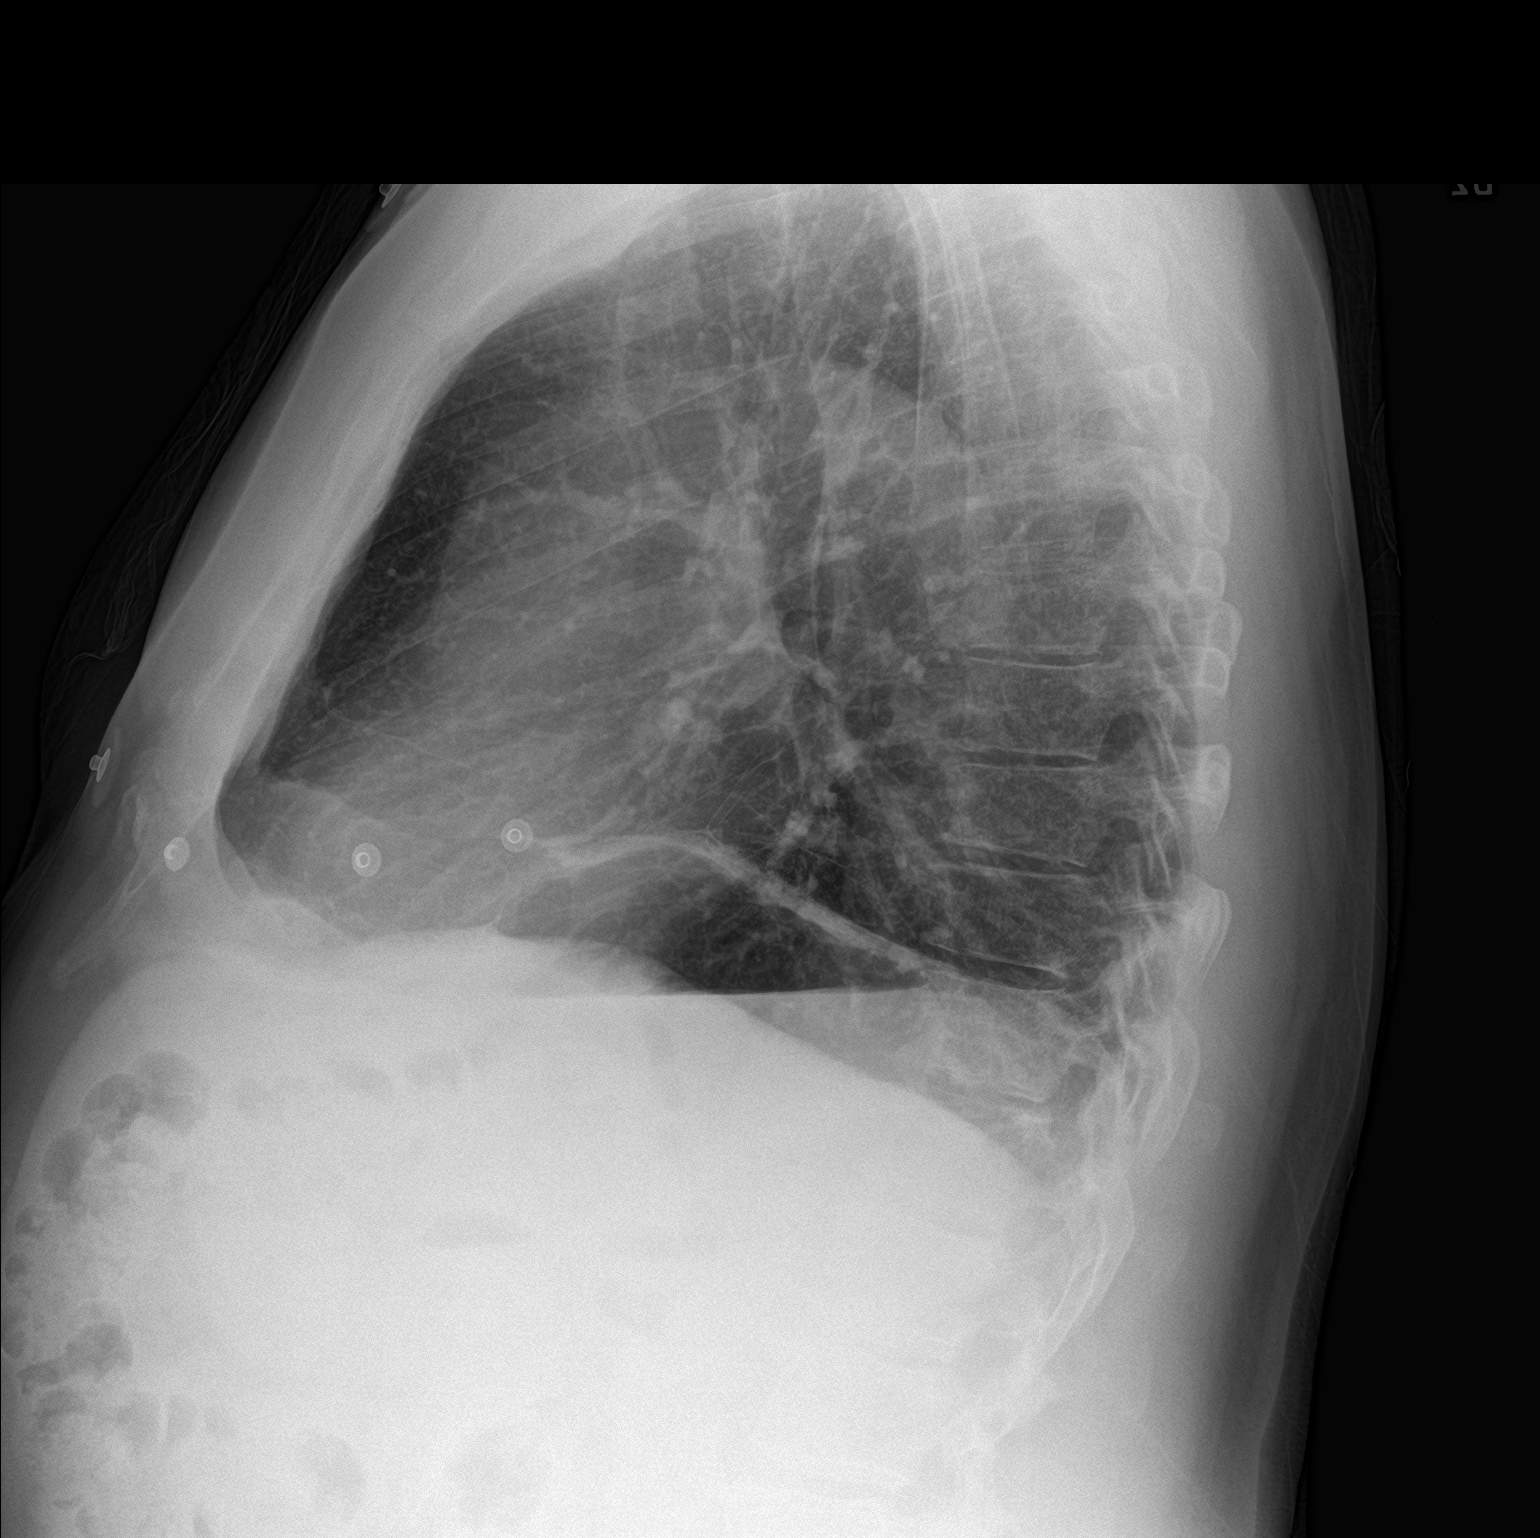

[2 of 2 positions shown; findings below may reference images not displayed]

FINDINGS: Cardiac silhouette is normal in size. No mediastinal or hilar masses
or evidence of adenopathy.

Minor linear atelectasis at the left lung base. Lungs are otherwise
clear. No pleural effusion or pneumothorax.

Skeletal structures are intact.
IMPRESSION: No active cardiopulmonary disease.
